# Patient Record
Sex: Male | Born: 1995 | Hispanic: Yes | Marital: Single | Smoking: Never smoker
Health system: Southern US, Community
[De-identification: ages and names within clinical notes are randomized; demographics above are authoritative.]

## PROBLEM LIST (undated history)

## (undated) HISTORY — PX: NO PAST SURGERIES: SHX2092

## (undated) HISTORY — PX: CLAVICLE SURGERY: SHX598

---
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
## 2015-04-18 ENCOUNTER — Encounter (HOSPITAL_COMMUNITY): Payer: Self-pay | Admitting: Emergency Medicine

## 2015-04-18 ENCOUNTER — Emergency Department (HOSPITAL_COMMUNITY): Payer: Medicaid Other

## 2015-04-18 ENCOUNTER — Emergency Department (HOSPITAL_COMMUNITY)
Admission: EM | Admit: 2015-04-18 | Discharge: 2015-04-18 | Disposition: A | Discharge status: Home / Self Care | Payer: Medicaid Other | Attending: Emergency Medicine | Admitting: Emergency Medicine

## 2015-04-18 DIAGNOSIS — R51 Headache: Secondary | ICD-10-CM | POA: Diagnosis present

## 2015-04-18 DIAGNOSIS — Z7982 Long term (current) use of aspirin: Secondary | ICD-10-CM | POA: Diagnosis not present

## 2015-04-18 DIAGNOSIS — H539 Unspecified visual disturbance: Secondary | ICD-10-CM | POA: Diagnosis not present

## 2015-04-18 DIAGNOSIS — R519 Headache, unspecified: Secondary | ICD-10-CM

## 2015-04-18 DIAGNOSIS — R42 Dizziness and giddiness: Secondary | ICD-10-CM | POA: Diagnosis not present

## 2015-04-18 LAB — CBC WITH DIFFERENTIAL/PLATELET
Basophils Absolute: 0 10*3/uL (ref 0.0–0.1)
Basophils Relative: 0 % (ref 0–1)
EOS PCT: 2 % (ref 0–5)
Eosinophils Absolute: 0.2 10*3/uL (ref 0.0–0.7)
HCT: 45.1 % (ref 39.0–52.0)
Hemoglobin: 15.2 g/dL (ref 13.0–17.0)
LYMPHS PCT: 33 % (ref 12–46)
Lymphs Abs: 2.6 10*3/uL (ref 0.7–4.0)
MCH: 29.9 pg (ref 26.0–34.0)
MCHC: 33.7 g/dL (ref 30.0–36.0)
MCV: 88.6 fL (ref 78.0–100.0)
MONO ABS: 0.5 10*3/uL (ref 0.1–1.0)
MONOS PCT: 6 % (ref 3–12)
Neutro Abs: 4.6 10*3/uL (ref 1.7–7.7)
Neutrophils Relative %: 59 % (ref 43–77)
Platelets: 262 10*3/uL (ref 150–400)
RBC: 5.09 MIL/uL (ref 4.22–5.81)
RDW: 12.6 % (ref 11.5–15.5)
WBC: 8 10*3/uL (ref 4.0–10.5)

## 2015-04-18 LAB — COMPREHENSIVE METABOLIC PANEL
ALBUMIN: 3.9 g/dL (ref 3.5–5.0)
ALT: 21 U/L (ref 17–63)
AST: 19 U/L (ref 15–41)
Alkaline Phosphatase: 56 U/L (ref 38–126)
Anion gap: 8 (ref 5–15)
BILIRUBIN TOTAL: 0.8 mg/dL (ref 0.3–1.2)
BUN: 16 mg/dL (ref 6–20)
CO2: 23 mmol/L (ref 22–32)
CREATININE: 0.65 mg/dL (ref 0.61–1.24)
Calcium: 9.1 mg/dL (ref 8.9–10.3)
Chloride: 106 mmol/L (ref 101–111)
GFR calc Af Amer: >60 mL/min (ref >60)
GFR calc non Af Amer: >60 mL/min (ref >60)
Glucose, Bld: 93 mg/dL (ref 70–99)
POTASSIUM: 3.9 mmol/L (ref 3.5–5.1)
Sodium: 137 mmol/L (ref 135–145)
Total Protein: 6.9 g/dL (ref 6.5–8.1)

## 2015-04-18 MED ORDER — KETOROLAC TROMETHAMINE 30 MG/ML IJ SOLN
30.0000 mg | Freq: Once | INTRAMUSCULAR | Status: AC
  Administered 2015-04-18: 30 mg via INTRAVENOUS
  Filled 2015-04-18: qty 1

## 2015-04-18 MED ORDER — SODIUM CHLORIDE 0.9 % IV BOLUS (SEPSIS)
1000.0000 mL | Freq: Once | INTRAVENOUS | Status: AC
  Administered 2015-04-18: 1000 mL via INTRAVENOUS

## 2015-04-18 MED ORDER — NAPROXEN 500 MG PO TABS: 500.0000 mg | ORAL_TABLET | Freq: Two times a day (BID) | ORAL | Status: DC

## 2015-04-18 MED ORDER — PROCHLORPERAZINE EDISYLATE 5 MG/ML IJ SOLN
10.0000 mg | Freq: Once | INTRAMUSCULAR | Status: AC
  Administered 2015-04-18: 10 mg via INTRAVENOUS
  Filled 2015-04-18: qty 2

## 2015-04-18 MED ORDER — DIPHENHYDRAMINE HCL 50 MG/ML IJ SOLN
25.0000 mg | Freq: Once | INTRAMUSCULAR | Status: AC
  Administered 2015-04-18: 25 mg via INTRAVENOUS
  Filled 2015-04-18: qty 1

## 2015-04-18 NOTE — Discharge Instructions (Signed)
You are having a headache. No specific cause was found today for your headache. It may have been a migraine or other cause of headache. Stress, anxiety, fatigue, and depression are common triggers for headaches. Your headache today does not appear to be life-threatening or require hospitalization, but often the exact cause of headaches is not determined in the emergency department. Therefore, follow-up with your doctor is very important to find out what may have caused your headache, and whether or not you need any further diagnostic testing or treatment. Sometimes headaches can appear benign (not harmful), but then more serious symptoms can develop which should prompt an immediate re-evaluation by your doctor or the emergency department. SEEK MEDICAL ATTENTION IF: You develop possible problems with medications prescribed.  The medications don't resolve your headache, if it recurs , or if you have multiple episodes of vomiting or can't take fluids. You have a change from the usual headache. RETURN IMMEDIATELY IF you develop a sudden, severe headache or confusion, become poorly responsive or faint, develop a fever above 100.69F or problem breathing, have a change in speech, vision, swallowing, or understanding, or develop new weakness, numbness, tingling, incoordination, or have a seizure.   General Headache Without Cause A headache is pain or discomfort felt around the head or neck area. The specific cause of a headache may not be found. There are many causes and types of headaches. A few common ones are:  Tension headaches.  Migraine headaches.  Cluster headaches.  Chronic daily headaches. HOME CARE INSTRUCTIONS   Keep all follow-up appointments with your caregiver or any specialist referral.  Only take over-the-counter or prescription medicines for pain or discomfort as directed by your caregiver.  Lie down in a dark, quiet room when you have a headache.  Keep a headache journal to find  out what may trigger your migraine headaches. For example, write down:  What you eat and drink.  How much sleep you get.  Any change to your diet or medicines.  Try massage or other relaxation techniques.  Put ice packs or heat on the head and neck. Use these 3 to 4 times per day for 15 to 20 minutes each time, or as needed.  Limit stress.  Sit up straight, and do not tense your muscles.  Quit smoking if you smoke.  Limit alcohol use.  Decrease the amount of caffeine you drink, or stop drinking caffeine.  Eat and sleep on a regular schedule.  Get 7 to 9 hours of sleep, or as recommended by your caregiver.  Keep lights dim if bright lights bother you and make your headaches worse. SEEK MEDICAL CARE IF:   You have problems with the medicines you were prescribed.  Your medicines are not working.  You have a change from the usual headache.  You have nausea or vomiting. SEEK IMMEDIATE MEDICAL CARE IF:   Your headache becomes severe.  You have a fever.  You have a stiff neck.  You have loss of vision.  You have muscular weakness or loss of muscle control.  You start losing your balance or have trouble walking.  You feel faint or pass out.  You have severe symptoms that are different from your first symptoms. MAKE SURE YOU:   Understand these instructions.  Will watch your condition.  Will get help right away if you are not doing well or get worse. Document Released: 11/24/2005 Document Revised: 02/16/2012 Document Reviewed: 12/10/2011 [facility] Patient Information 2015 [place], [place]. This information is not intended  to replace advice given to you by your health care provider. Make sure you discuss any questions you have with your health care provider.  Migraine Headache A migraine headache is an intense, throbbing pain on one or both sides of your head. A migraine can last for 30 minutes to several hours. CAUSES  The exact cause of a migraine headache is  not always known. However, a migraine may be caused when nerves in the brain become irritated and release chemicals that cause inflammation. This causes pain. Certain things may also trigger migraines, such as:  Alcohol.  Smoking.  Stress.  Menstruation.  Aged cheeses.  Foods or drinks that contain nitrates, glutamate, aspartame, or tyramine.  Lack of sleep.  Chocolate.  Caffeine.  Hunger.  Physical exertion.  Fatigue.  Medicines used to treat chest pain (nitroglycerine), birth control pills, estrogen, and some blood pressure medicines. SIGNS AND SYMPTOMS  Pain on one or both sides of your head.  Pulsating or throbbing pain.  Severe pain that prevents daily activities.  Pain that is aggravated by any physical activity.  Nausea, vomiting, or both.  Dizziness.  Pain with exposure to bright lights, loud noises, or activity.  General sensitivity to bright lights, loud noises, or smells. Before you get a migraine, you may get warning signs that a migraine is coming (aura). An aura may include:  Seeing flashing lights.  Seeing bright spots, halos, or zigzag lines.  Having tunnel vision or blurred vision.  Having feelings of numbness or tingling.  Having trouble talking.  Having muscle weakness. DIAGNOSIS  A migraine headache is often diagnosed based on:  Symptoms.  Physical exam.  A CT scan or MRI of your head. These imaging tests cannot diagnose migraines, but they can help rule out other causes of headaches. TREATMENT Medicines may be given for pain and nausea. Medicines can also be given to help prevent recurrent migraines.  HOME CARE INSTRUCTIONS  Only take over-the-counter or prescription medicines for pain or discomfort as directed by your health care provider. The use of long-term narcotics is not recommended.  Lie down in a dark, quiet room when you have a migraine.  Keep a journal to find out what may trigger your migraine headaches. For  example, write down:  What you eat and drink.  How much sleep you get.  Any change to your diet or medicines.  Limit alcohol consumption.  Quit smoking if you smoke.  Get 7-9 hours of sleep, or as recommended by your health care provider.  Limit stress.  Keep lights dim if bright lights bother you and make your migraines worse. SEEK IMMEDIATE MEDICAL CARE IF:   Your migraine becomes severe.  You have a fever.  You have a stiff neck.  You have vision loss.  You have muscular weakness or loss of muscle control.  You start losing your balance or have trouble walking.  You feel faint or pass out.  You have severe symptoms that are different from your first symptoms. MAKE SURE YOU:   Understand these instructions.  Will watch your condition.  Will get help right away if you are not doing well or get worse. Document Released: 11/24/2005 Document Revised: 04/10/2014 Document Reviewed: 08/01/2013 [facility] Patient Information 2015 [place], [place]. This information is not intended to replace advice given to you by your health care provider. Make sure you discuss any questions you have with your health care provider.

## 2015-04-18 NOTE — ED Notes (Signed)
PA at bedside.

## 2015-04-18 NOTE — ED Notes (Signed)
Pt reports headaches intermittent and dizziness for 2 weeks. Reports blurry vision with a headache on Saturday and Monday. Denies weakness, slurred speech, or other neuro deficits at this time. Last episode of dizziness was Monday.

## 2015-04-18 NOTE — ED Provider Notes (Signed)
CSN: [id number]     Arrival date & time 04/18/15  1049 History  This chart was scribed for a non-physician practitioner, Arthor CaptainAbigail Braydon Kullman, PA-C working with Jerelyn ScottMartha Linker, MD by [place] Peace, ED Scribe. The patient was seen in TR01C/TR01C. [name] care was started at 4:12 PM.    Chief Complaint  Patient presents with  . Headache      Patient is a 19 y.o. male presenting with headaches. The history is provided by the patient. No language interpreter was used.  Headache Associated symptoms: dizziness   Associated symptoms: no fever, no nausea, no neck pain, no vomiting and no weakness     HPI Comments: Marcus Jackson is a 19 y.o. male who presents to the Emergency Department complaining of intermittent episodes of headache over the past 2 weeks with dizziness. Pt explains each episodes last about 10 mins and move to different areas of his head with varying levels of pain. Rates worst pain as 7/10. He reports episode a few days ago where he started to experience blurred vision that lasted about 30 seconds. Pt reports he is not currently experiencing a headache. No complaints of weakness or slurred speech. He denies any neck pain, nausea, vomiting, or fever. Pt has not taken any medication to address symptoms. No family history of strokes.    History reviewed. No pertinent past medical history. Past Surgical History  Procedure Laterality Date  . Clavicle surgery    . Anterior cervical decomp/discectomy fusion N/A 10/14/2013    Procedure: Anterior cervical decompression fusion Cervical five-six;  Surgeon: Carmela HurtKyle L Cabbell, MD;  Location: [facility];  Service: Neurosurgery;  Laterality: N/A;  ACDF Cervical five-six   History reviewed. No pertinent family history. History  Substance Use Topics  . Smoking status: Never Smoker   . Smokeless tobacco: Not on file  . Alcohol Use: No    Review of Systems  Constitutional: Negative for fever.  Eyes: Positive for visual disturbance.   Gastrointestinal: Negative for nausea and vomiting.  Musculoskeletal: Negative for neck pain.  Neurological: Positive for dizziness and headaches. Negative for speech difficulty and weakness.  All other systems reviewed and are negative. Ten systems reviewed and are negative for acute change, except as noted in the HPI.      Allergies  Review of patient's allergies indicates no known allergies.  Home Medications   Prior to Admission medications   Medication Sig Start Date End Date Taking? Authorizing Provider  aspirin EC 325 MG tablet Take 325 mg by mouth daily.    Historical Provider, MD  cyclobenzaprine (FLEXERIL) 10 MG tablet Take 1 tablet (10 mg total) by mouth 3 (three) times daily as needed for muscle spasms. 10/14/13   Coletta MemosKyle Cabbell, MD  HYDROcodone-acetaminophen (NORCO/VICODIN) 5-325 MG per tablet Take 1 tablet by mouth every 6 (six) hours as needed for moderate pain. 10/14/13   Coletta MemosKyle Cabbell, MD   BP 133/64 mmHg  Pulse 63  Temp(Src) 98.1 F (36.7 C) (Oral)  Resp 18  SpO2 97% Physical Exam  Constitutional: He is oriented to person, place, and time. He appears well-developed and well-nourished. No distress.  HENT:  Head: Normocephalic and atraumatic.  Mouth/Throat: Oropharynx is clear and moist.  Eyes: Conjunctivae and EOM are normal. Pupils are equal, round, and reactive to light. No scleral icterus.  No horizontal, vertical or rotational nystagmus  Neck: Normal range of motion. Neck supple. No tracheal deviation present.  Full active and passive ROM without pain No midline or paraspinal tenderness No  nuchal rigidity or meningeal signs  Cardiovascular: Normal rate, regular rhythm and intact distal pulses.   Pulmonary/Chest: Effort normal and breath sounds normal. No respiratory distress. He has no wheezes. He has no rales.  Abdominal: Soft. Bowel sounds are normal. There is no tenderness. There is no rebound and no guarding.  Musculoskeletal: Normal range of motion.   Lymphadenopathy:    He has no cervical adenopathy.  Neurological: He is alert and oriented to person, place, and time. He has normal reflexes. No cranial nerve deficit. He exhibits normal muscle tone. Coordination normal.  Mental Status:  Alert, oriented, thought content appropriate. Speech fluent without evidence of aphasia. Able to follow 2 step commands without difficulty.  Cranial Nerves:  II:  Peripheral visual fields grossly normal, pupils equal, round, reactive to light III,IV, VI: ptosis not present, extra-ocular motions intact bilaterally  V,VII: smile symmetric, facial light touch sensation equal VIII: hearing grossly normal bilaterally  IX,X: gag reflex present  XI: bilateral shoulder shrug equal and strong XII: midline tongue extension  Motor:  5/5 in upper and lower extremities bilaterally including strong and equal grip strength and dorsiflexion/plantar flexion Sensory: Pinprick and light touch normal in all extremities.  Deep Tendon Reflexes: 2+ and symmetric  Cerebellar: normal finger-to-nose with bilateral upper extremities Gait: normal gait and balance CV: distal pulses palpable throughout   Skin: Skin is warm and dry. No rash noted. He is not diaphoretic.  Psychiatric: He has a normal mood and affect. His behavior is normal. Judgment and thought content normal.  Nursing note and vitals reviewed.   ED Course  Procedures (including critical care time) Labs Review Labs Reviewed  COMPREHENSIVE METABOLIC PANEL  CBC WITH DIFFERENTIAL/PLATELET    Imaging Review No results found.   EKG Interpretation None     Medications  sodium chloride 0.9 % bolus 1,000 mL (1,000 mLs Intravenous New Bag/Given 04/18/15 1546)  ketorolac (TORADOL) 30 MG/ML injection 30 mg (30 mg Intravenous Given 04/18/15 1547)  prochlorperazine (COMPAZINE) injection 10 mg (10 mg Intravenous Given 04/18/15 1549)  diphenhydrAMINE (BENADRYL) injection 25 mg (25 mg Intravenous Given 04/18/15 1551)    4:18 PM- Treatment plan was discussed with patient who verbalizes understanding and agrees.   MDM   Final diagnoses:  Headache    Negative head CT Pt HA treated and improved while in ED.  Presentation is like pts typical HA and non concerning for [facility], ICH, Meningitis, or temporal arteritis. Pt is afebrile with no focal neuro deficits, nuchal rigidity, or change in vision. Pt is to follow up with PCP to discuss prophylactic medication. Pt verbalizes understanding and is agreeable with plan to dc.    I personally performed the services described in this documentation, which was scribed in my presence. The recorded information has been reviewed and is accurate.      Arthor Captainbigail Makenah Karas, PA-C 04/20/15 1621  Raeford RazorStephen Kohut, MD 04/20/15 [phone]
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
## 2015-12-10 ENCOUNTER — Encounter (HOSPITAL_COMMUNITY): Payer: Self-pay | Admitting: Emergency Medicine

## 2015-12-10 ENCOUNTER — Emergency Department (INDEPENDENT_AMBULATORY_CARE_PROVIDER_SITE_OTHER)
Admission: EM | Admit: 2015-12-10 | Discharge: 2015-12-10 | Disposition: A | Discharge status: Home / Self Care | Payer: Medicaid Other | Source: Home / Self Care | Attending: Internal Medicine | Admitting: Internal Medicine

## 2015-12-10 DIAGNOSIS — S61412A Laceration without foreign body of left hand, initial encounter: Secondary | ICD-10-CM

## 2015-12-10 MED ORDER — LIDOCAINE-EPINEPHRINE-TETRACAINE (LET) SOLUTION
3.0000 mL | Freq: Once | NASAL | Status: AC
  Administered 2015-12-10: 3 mL via TOPICAL

## 2015-12-10 MED ORDER — CEPHALEXIN 500 MG PO CAPS: 500.0000 mg | ORAL_CAPSULE | Freq: Two times a day (BID) | ORAL | Status: DC

## 2015-12-10 MED ORDER — LIDOCAINE HCL (PF) 1 % IJ SOLN
INTRAMUSCULAR | Status: AC
  Filled 2015-12-10: qty 30

## 2015-12-10 MED ORDER — LIDOCAINE-EPINEPHRINE-TETRACAINE (LET) SOLUTION
NASAL | Status: AC
  Filled 2015-12-10: qty 3

## 2015-12-10 NOTE — Discharge Instructions (Signed)
4 outside and 2 inside stitches were placed.   Please followup with Dr Mina MarbleWeingold in 2-3 days to check wound,  and the strength/motion of the left 2nd finger. Leave bandage on until you see Dr Mina MarbleWeingold. Prescriptions for keflex (antibiotic) and naprox[facility] (for pain)  [facility] [address]   Laceration Care, Adult A laceration is a cut that goes through all layers of the skin. The cut also goes into the tissue that is right under the skin. Some cuts heal on their own. Others need to be closed with stitches (sutures), staples, skin adhesive strips, or wound glue. Taking care of your cut lowers your risk of infection and helps your cut to heal better. HOW TO TAKE CARE OF YOUR CUT For stitches or staples:  Keep the wound clean and dry.  If you were given a bandage (dressing), you should change it at least one time per day or as told by your doctor. You should also change it if it gets wet or dirty.  Keep the wound completely dry for the first 24 hours or as told by your doctor. After that time, you may take a shower or a bath. However, make sure that the wound is not soaked in water until after the stitches or staples have been removed.  Clean the wound one time each day or as told by your doctor:  Wash the wound with soap and water.  Rinse the wound with water until all of the soap comes off.  Pat the wound dry with a clean towel. Do not rub the wound.  After you clean the wound, put a thin layer of antibiotic ointment on it as told by your doctor. This ointment:  Helps to prevent infection.  Keeps the bandage from sticking to the wound.  Have your stitches or staples removed as told by your doctor. If your doctor used skin adhesive strips:   Keep the wound clean and dry.  If you were given a bandage, you should change it at least one time per day or as told by your doctor. You should also change it if it gets dirty or wet.  Do not get the skin adhesive strips wet. You  can take a shower or a bath, but be careful to keep the wound dry.  If the wound gets wet, pat it dry with a clean towel. Do not rub the wound.  Skin adhesive strips fall off on their own. You can trim the strips as the wound heals. Do not remove any strips that are still stuck to the wound. They will fall off after a while. If your doctor used wound glue:  Try to keep your wound dry, but you may briefly wet it in the shower or bath. Do not soak the wound in water, such as by swimming.  After you take a shower or a bath, gently pat the wound dry with a clean towel. Do not rub the wound.  Do not do any activities that will make you really sweaty until the skin glue has fallen off on its own.  Do not apply liquid, cream, or ointment medicine to your wound while the skin glue is still on.  If you were given a bandage, you should change it at least one time per day or as told by your doctor. You should also change it if it gets dirty or wet.  If a bandage is placed over the wound, do not let the tape for the  bandage touch the skin glue.  Do not pick at the glue. The skin glue usually stays on for 5-10 days. Then, it falls off of the skin. General Instructions  To help prevent scarring, make sure to cover your wound with sunscreen whenever you are outside after stitches are removed, after adhesive strips are removed, or when wound glue stays in place and the wound is healed. Make sure to wear a sunscreen of at least 30 SPF.  Take over-the-counter and prescription medicines only as told by your doctor.  If you were given antibiotic medicine or ointment, take or apply it as told by your doctor. Do not stop using the antibiotic even if your wound is getting better.  Do not scratch or pick at the wound.  Keep all follow-up visits as told by your doctor. This is important.  Check your wound every day for signs of infection. Watch for:  Redness, swelling, or pain.  Fluid, blood, or  pus.  Raise (elevate) the injured area above the level of your heart while you are sitting or lying down, if possible. GET HELP IF:  You got a tetanus shot and you have any of these problems at the injection site:  Swelling.  Very bad pain.  Redness.  Bleeding.  You have a fever.  A wound that was closed breaks open.  You notice a bad smell coming from your wound or your bandage.  You notice something coming out of the wound, such as wood or glass.  Medicine does not help your pain.  You have more redness, swelling, or pain at the site of your wound.  You have fluid, blood, or pus coming from your wound.  You notice a change in the color of your skin near your wound.  You need to change the bandage often because fluid, blood, or pus is coming from the wound.  You start to have a new rash.  You start to have numbness around the wound. GET HELP RIGHT AWAY IF:  You have very bad swelling around the wound.  Your pain suddenly gets worse and is very bad.  You notice painful lumps near the wound or on skin that is anywhere on your body.  You have a red streak going away from your wound.  The wound is on your hand or foot and you cannot move a finger or toe like you usually can.  The wound is on your hand or foot and you notice that your fingers or toes look pale or bluish.   This information is not intended to replace advice given to you by your health care provider. Make sure you discuss any questions you have with your health care provider.   Document Released: 05/12/2008 Document Revised: 04/10/2015 Document Reviewed: 11/20/2014 Elsevier Interactive Patient Education [employer]

## 2015-12-10 NOTE — ED Notes (Addendum)
Patient reports he was using a power saw and cut his left index finger and thumb about one hour ago. Laceration is about 1 inch long on index finger and 0.5 inch on thumb.  Patient denies numbness. Last tetanus in 2014 per patient.

## 2015-12-11 NOTE — ED Provider Notes (Signed)
CSN: [id number]     Arrival date & time 12/10/15  1536 History   First MD Initiated Contact with Patient 12/10/15 1645     Chief Complaint  Patient presents with  . Hand Injury   HPI  Using a rotary tool with a toothed blade to carve a design in wood today, tool slipped and presents now with abrasion to ulnar aspect L thumb, and deep irregular lac 2" over left 2nd MCP. Moving L hand well. No other injury reported.  Had tetanus shot in 2014.  History reviewed. No pertinent past medical history. Past Surgical History  Procedure Laterality Date       . Orif clavicular fracture Left 09/20/2013    Procedure: OPEN REDUCTION INTERNAL FIXATION (ORIF) CLAVICULAR FRACTURE, Dressing application to left hand;  Surgeon: Sheral Apley, MD;  Location: [facility] OR;  Service: Orthopedics;  Laterality: Left;   History reviewed. No pertinent family history. Social History  Substance Use Topics  . Smoking status: Never Smoker   . Smokeless tobacco: None  . Alcohol Use: No    Review of Systems  All other systems reviewed and are negative.   Allergies  Review of patient's allergies indicates no known allergies.  Home Medications   Prior to Admission medications   Medication Sig Start Date End Date Taking? Authorizing Provider  aspirin EC 325 MG tablet Take 1 tablet (325 mg total) by mouth daily. 09/20/13   Sheral Apley, MD  cephALEXin (KEFLEX) 500 MG capsule Take 1 capsule (500 mg total) by mouth 2 (two) times daily. 12/10/15   Eustace Moore, MD  methocarbamol (ROBAXIN) 500 MG tablet Take 1 tablet (500 mg total) by mouth 2 (two) times daily. 09/18/13   Susy Frizzle, MD  oxyCODONE-acetaminophen (PERCOCET/ROXICET) 5-325 MG per tablet Take 2 tablets by mouth every 4 (four) hours as needed for pain. 09/18/13   Susy Frizzle, MD  oxyCODONE-acetaminophen (ROXICET) 5-325 MG per tablet Take 2 tablets by mouth every 4 (four) hours as needed for pain. 09/20/13   Sheral Apley, MD   Meds Ordered and  Administered this Visit   Medications  lidocaine-EPINEPHrine-tetracaine (LET) solution (3 mLs Topical Given 12/10/15 1638)  1% lidocaine without epi, 4 cc, for wound infiltration by provider  BP 134/77 mmHg  Pulse 74  Temp(Src) 99 F (37.2 C) (Oral)  Resp 14  SpO2 100%   Physical Exam  Constitutional: He is oriented to person, place, and time. No distress.  Alert, nicely groomed  HENT:  Head: Atraumatic.  Eyes:  Conjugate gaze, no eye redness/drainage  Neck: Neck supple.  Cardiovascular: Normal rate.   Pulmonary/Chest: No respiratory distress.  Abdominal: He exhibits no distension.  Musculoskeletal:  ROM full in left 2nd finger, left thumb. Resisted flexion/extension at L 2nd MCP, PIP, strong. Deep 2" laceration, stellate, dorsal L 2nd MCP. 1.25" full thickness abrasion ulnar aspect of L thumb at IP.  Neurological: He is alert and oriented to person, place, and time.  Skin: Skin is warm and dry.  No cyanosis  Nursing note and vitals reviewed.   ED Course  Procedures (including critical care time)  LET applied to deep laceration over left, dorsal 2nd MCP. Wound infiltrated with 1% lidocaine without epi.   Wound washed with hibiclens/saline and irrigated copiously with saline. Resisted flexion/extension at MCP and PIP strong. Placed #2 4-0 vicryl sutures to loosely approximate gaping deep space and #4 4-0 ethilon sutures to loosely approximate superficial tissues. Dry dressing/splint applied by nurse. Pt to leave  in place until followup with Dr Dwana CurdWeingold/hand in 2days for wound check, check strength/motion in L 2nd finger.   MDM   1. Laceration of left hand, initial encounter    Discharge Medication List as of 12/10/2015  5:57 PM    START taking these medications   Details  cephALEXin (KEFLEX) 500 MG capsule Take 1 capsule (500 mg total) by mouth 2 (two) times daily., Starting 12/10/2015, Until Discontinued, Normal           Eustace MooreLaura W Kynadee Dam, MD 12/12/15 1057

## 2016-07-31 ENCOUNTER — Encounter (HOSPITAL_COMMUNITY): Payer: Self-pay | Admitting: Emergency Medicine

## 2017-09-08 ENCOUNTER — Encounter (HOSPITAL_COMMUNITY): Payer: Self-pay

## 2017-09-08 ENCOUNTER — Emergency Department (HOSPITAL_COMMUNITY): Payer: Worker's Compensation

## 2017-09-08 ENCOUNTER — Emergency Department (HOSPITAL_COMMUNITY)
Admission: EM | Admit: 2017-09-08 | Discharge: 2017-09-08 | Disposition: A | Discharge status: Home / Self Care | Payer: Worker's Compensation | Attending: Emergency Medicine | Admitting: Emergency Medicine

## 2017-09-08 DIAGNOSIS — Y92410 Unspecified street and highway as the place of occurrence of the external cause: Secondary | ICD-10-CM | POA: Diagnosis not present

## 2017-09-08 DIAGNOSIS — S93402A Sprain of unspecified ligament of left ankle, initial encounter: Secondary | ICD-10-CM | POA: Diagnosis not present

## 2017-09-08 DIAGNOSIS — R0789 Other chest pain: Secondary | ICD-10-CM

## 2017-09-08 DIAGNOSIS — Y9389 Activity, other specified: Secondary | ICD-10-CM | POA: Diagnosis not present

## 2017-09-08 DIAGNOSIS — R109 Unspecified abdominal pain: Secondary | ICD-10-CM | POA: Insufficient documentation

## 2017-09-08 DIAGNOSIS — Y999 Unspecified external cause status: Secondary | ICD-10-CM | POA: Insufficient documentation

## 2017-09-08 DIAGNOSIS — S8001XA Contusion of right knee, initial encounter: Secondary | ICD-10-CM | POA: Insufficient documentation

## 2017-09-08 DIAGNOSIS — S8991XA Unspecified injury of right lower leg, initial encounter: Secondary | ICD-10-CM | POA: Diagnosis present

## 2017-09-08 LAB — CBC WITH DIFFERENTIAL/PLATELET
Basophils Absolute: 0 10*3/uL (ref 0.0–0.1)
Basophils Relative: 0 %
Eosinophils Absolute: 0.2 10*3/uL (ref 0.0–0.7)
Eosinophils Relative: 1 %
HCT: 44.5 % (ref 39.0–52.0)
Hemoglobin: 15.2 g/dL (ref 13.0–17.0)
Lymphocytes Relative: 23 %
Lymphs Abs: 3.4 10*3/uL (ref 0.7–4.0)
MCH: 31 pg (ref 26.0–34.0)
MCHC: 34.2 g/dL (ref 30.0–36.0)
MCV: 90.6 fL (ref 78.0–100.0)
Monocytes Absolute: 0.9 10*3/uL (ref 0.1–1.0)
Monocytes Relative: 6 %
Neutro Abs: 10.3 10*3/uL — ABNORMAL HIGH (ref 1.7–7.7)
Neutrophils Relative %: 70 %
Platelets: 264 10*3/uL (ref 150–400)
RBC: 4.91 MIL/uL (ref 4.22–5.81)
RDW: 12.9 % (ref 11.5–15.5)
WBC: 14.7 10*3/uL — ABNORMAL HIGH (ref 4.0–10.5)

## 2017-09-08 LAB — COMPREHENSIVE METABOLIC PANEL
ALT: 29 U/L (ref 17–63)
AST: 26 U/L (ref 15–41)
Albumin: 4 g/dL (ref 3.5–5.0)
Alkaline Phosphatase: 57 U/L (ref 38–126)
Anion gap: 9 (ref 5–15)
BUN: 16 mg/dL (ref 6–20)
CO2: 24 mmol/L (ref 22–32)
Calcium: 9 mg/dL (ref 8.9–10.3)
Chloride: 103 mmol/L (ref 101–111)
Creatinine, Ser: 0.7 mg/dL (ref 0.61–1.24)
GFR calc Af Amer: >60 mL/min (ref >60)
GFR calc non Af Amer: >60 mL/min (ref >60)
Glucose, Bld: 94 mg/dL (ref 65–99)
Potassium: 4.1 mmol/L (ref 3.5–5.1)
Sodium: 136 mmol/L (ref 135–145)
Total Bilirubin: 0.9 mg/dL (ref 0.3–1.2)
Total Protein: 6.7 g/dL (ref 6.5–8.1)

## 2017-09-08 LAB — LIPASE, BLOOD: Lipase: 24 U/L (ref 11–51)

## 2017-09-08 MED ORDER — MORPHINE SULFATE (PF) 4 MG/ML IV SOLN
4.0000 mg | Freq: Once | INTRAVENOUS | Status: AC
  Administered 2017-09-08: 4 mg via INTRAVENOUS
  Filled 2017-09-08: qty 1

## 2017-09-08 MED ORDER — IOPAMIDOL (ISOVUE-300) INJECTION 61%
INTRAVENOUS | Status: DC
Start: 2017-09-08 — End: 2017-09-08
  Filled 2017-09-08: qty 100

## 2017-09-08 MED ORDER — IOPAMIDOL (ISOVUE-300) INJECTION 61%
INTRAVENOUS | Status: AC
  Administered 2017-09-08: 100 mL
  Filled 2017-09-08: qty 100

## 2017-09-08 MED ORDER — TRAMADOL HCL 50 MG PO TABS: 50.0000 mg | ORAL_TABLET | Freq: Four times a day (QID) | ORAL | 0 refills | Status: DC | PRN

## 2017-09-08 NOTE — ED Notes (Signed)
Patient transported to CT 

## 2017-09-08 NOTE — Discharge Instructions (Signed)
Please read attached information. If you experience any new or worsening signs or symptoms please return to the emergency room for evaluation. Please follow-up with your primary care provider or specialist as discussed. Please use medication prescribed only as directed and discontinue taking if you have any concerning signs or symptoms.   °

## 2017-09-08 NOTE — Progress Notes (Signed)
Orthopedic Tech Progress Note Patient Details:  Marcus Jackson October 31, 1996 [id number]  Ortho Devices Type of Ortho Device: ASO Ortho Device/Splint Location: LLE Ortho Device/Splint Interventions: Ordered, Application   Jennye Moccasin 09/08/2017, 6:11 PM
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
## 2017-09-15 ENCOUNTER — Emergency Department (HOSPITAL_COMMUNITY)
Admission: EM | Admit: 2017-09-15 | Discharge: 2017-09-15 | Disposition: A | Discharge status: Home / Self Care | Payer: Worker's Compensation | Attending: Emergency Medicine | Admitting: Emergency Medicine

## 2017-09-15 ENCOUNTER — Emergency Department (HOSPITAL_COMMUNITY): Payer: Worker's Compensation

## 2017-09-15 ENCOUNTER — Encounter (HOSPITAL_COMMUNITY): Payer: Self-pay

## 2017-09-15 DIAGNOSIS — R0789 Other chest pain: Secondary | ICD-10-CM | POA: Diagnosis not present

## 2017-09-15 DIAGNOSIS — R079 Chest pain, unspecified: Secondary | ICD-10-CM | POA: Diagnosis present

## 2017-09-15 LAB — I-STAT TROPONIN, ED: TROPONIN I, POC: 0 ng/mL (ref 0.00–0.08)

## 2017-09-15 MED ORDER — NAPROXEN 375 MG PO TABS: 375.0000 mg | ORAL_TABLET | Freq: Two times a day (BID) | ORAL | 0 refills | Status: DC

## 2017-09-15 MED ORDER — CYCLOBENZAPRINE HCL 10 MG PO TABS: 10.0000 mg | ORAL_TABLET | Freq: Three times a day (TID) | ORAL | 0 refills | Status: DC | PRN
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
## 2020-01-09 ENCOUNTER — Other Ambulatory Visit: Payer: Self-pay | Admitting: Cardiology

## 2020-01-09 DIAGNOSIS — Z20822 Contact with and (suspected) exposure to covid-19: Secondary | ICD-10-CM

## 2020-01-10 LAB — NOVEL CORONAVIRUS, NAA: SARS-CoV-2, NAA: NOT DETECTED

## 2020-07-27 ENCOUNTER — Ambulatory Visit (HOSPITAL_COMMUNITY)
Admission: EM | Admit: 2020-07-27 | Discharge: 2020-07-27 | Disposition: A | Discharge status: Home / Self Care | Payer: HRSA Program | Attending: Family Medicine | Admitting: Family Medicine

## 2020-07-27 ENCOUNTER — Other Ambulatory Visit: Payer: Self-pay

## 2020-07-27 ENCOUNTER — Encounter (HOSPITAL_COMMUNITY): Payer: Self-pay | Admitting: Emergency Medicine

## 2020-07-27 DIAGNOSIS — Z20822 Contact with and (suspected) exposure to covid-19: Secondary | ICD-10-CM | POA: Diagnosis present

## 2020-07-27 DIAGNOSIS — J069 Acute upper respiratory infection, unspecified: Secondary | ICD-10-CM | POA: Insufficient documentation

## 2020-07-27 LAB — SARS CORONAVIRUS 2 (TAT 6-24 HRS): SARS Coronavirus 2: POSITIVE — AB

## 2020-07-27 MED ORDER — IBUPROFEN 800 MG PO TABS: 800.0000 mg | ORAL_TABLET | Freq: Three times a day (TID) | ORAL | 0 refills | Status: AC

## 2020-07-27 MED ORDER — ACETAMINOPHEN 325 MG PO TABS
ORAL_TABLET | ORAL | Status: AC
  Filled 2020-07-27: qty 2

## 2020-07-27 MED ORDER — ACETAMINOPHEN 325 MG PO TABS
650.0000 mg | ORAL_TABLET | Freq: Once | ORAL | Status: AC
  Administered 2020-07-27: 650 mg via ORAL

## 2020-07-27 MED ORDER — BENZONATATE 200 MG PO CAPS: 200.0000 mg | ORAL_CAPSULE | Freq: Three times a day (TID) | ORAL | 0 refills | Status: AC | PRN

## 2020-07-27 NOTE — ED Triage Notes (Signed)
Pt c/o covid exposure last week Wednesday his inlaws tested positive. He states he started having cough, fever, and headache on Saturday. Pt states he has been taking tylenol and thera flu with relief of symptoms.

## 2020-07-27 NOTE — Discharge Instructions (Signed)
Covid test pending, monitor my chart for results Ibuprofen and Tylenol around-the-clock to help with fevers, headaches, body aches and chills Rest and drink plenty of fluids Benzonatate/Tessalon every 8 hours as needed for cough Please follow-up if any symptoms not improving or worsening, developing any difficulty breathing, shortness of breath

## 2020-07-27 NOTE — ED Provider Notes (Signed)
[facility]    CSN: [id number] Arrival date & time: 07/27/20  0825      History   Chief Complaint Chief Complaint  Patient presents with  . Covid Exposure    HPI Marcus Jackson is a 24 y.o. male no significant past medical history presenting today for URI symptoms and fever.  Patient had Covid exposure last week.  His in-laws have since tested positive.  He developed symptoms on Saturday including cough, congestion, headache chills and fever.  Using Tylenol and TheraFlu.  Denies chest pain or shortness of breath.  Denies sore throat.  Has had some nausea, but denies vomiting.  HPI  History reviewed. No pertinent past medical history.  There are no problems to display for this patient.   Past Surgical History:  Procedure Laterality Date  . ANTERIOR CERVICAL DECOMP/DISCECTOMY FUSION N/A 10/14/2013   Procedure: Anterior cervical decompression fusion Cervical five-six;  Surgeon: Carmela Hurt, MD;  Location: [facility];  Service: Neurosurgery;  Laterality: N/A;  ACDF Cervical five-six  . CLAVICLE SURGERY    . NO PAST SURGERIES    . ORIF CLAVICULAR FRACTURE Left 09/20/2013   Procedure: OPEN REDUCTION INTERNAL FIXATION (ORIF) CLAVICULAR FRACTURE, Dressing application to left hand;  Surgeon: Sheral Apley, MD;  Location: [facility];  Service: Orthopedics;  Laterality: Left;       Home Medications    Prior to Admission medications   Medication Sig Start Date End Date Taking? Authorizing Provider  benzonatate (TESSALON) 200 MG capsule Take 1 capsule (200 mg total) by mouth 3 (three) times daily as needed for up to 7 days for cough. 07/27/20 08/03/20  Reinhold Rickey C, PA-C  ibuprofen (ADVIL) 800 MG tablet Take 1 tablet (800 mg total) by mouth 3 (three) times daily. 07/27/20   Kerissa Coia, Junius Creamer, PA-C    Family History Family History  Problem Relation Age of Onset  . Healthy Mother   . Healthy Father     Social History Social History   Tobacco Use  .  Smoking status: Never Smoker  . Smokeless tobacco: Current User    Types: Chew  Substance Use Topics  . Alcohol use: Yes    Comment: occ  . Drug use: No     Allergies   Patient has no known allergies.   Review of Systems Review of Systems  Constitutional: Positive for chills and fever. Negative for activity change, appetite change and fatigue.  HENT: Positive for congestion. Negative for ear pain, rhinorrhea, sinus pressure, sore throat and trouble swallowing.   Eyes: Negative for discharge and redness.  Respiratory: Positive for cough. Negative for chest tightness and shortness of breath.   Cardiovascular: Negative for chest pain.  Gastrointestinal: Positive for nausea. Negative for abdominal pain, diarrhea and vomiting.  Musculoskeletal: Negative for myalgias.  Skin: Negative for rash.  Neurological: Negative for dizziness, light-headedness and headaches.     Physical Exam Triage Vital Signs ED Triage Vitals  Enc Vitals Group     BP 07/27/20 1044 104/62     Pulse Rate 07/27/20 1044 84     Resp 07/27/20 1044 16     Temp 07/27/20 1044 (!) 102.7 F (39.3 C)     Temp Source 07/27/20 1044 Oral     SpO2 07/27/20 1044 97 %     Weight --      Height --      Head Circumference --      Peak Flow --  Pain Score 07/27/20 1039 7     Pain Loc --      Pain Edu? --      Excl. in GC? --    No data found.  Updated Vital Signs BP 104/62 (BP Location: Right Arm)   Pulse 84   Temp (!) 102.7 F (39.3 C) (Oral)   Resp 16   SpO2 97%   Visual Acuity Right Eye Distance:   Left Eye Distance:   Bilateral Distance:    Right Eye Near:   Left Eye Near:    Bilateral Near:     Physical Exam Vitals and nursing note reviewed.  Constitutional:      Appearance: He is well-developed.     Comments: No acute distress  HENT:     Head: Normocephalic and atraumatic.     Ears:     Comments: Bilateral ears without tenderness to palpation of external auricle, tragus and mastoid,  EAC's without erythema or swelling, TM's with good bony landmarks and cone of light. Non erythematous.     Nose: Nose normal.     Mouth/Throat:     Comments: Oral mucosa pink and moist, no tonsillar enlargement or exudate. Posterior pharynx patent and nonerythematous, no uvula deviation or swelling. Normal phonation. Eyes:     Conjunctiva/sclera: Conjunctivae normal.  Cardiovascular:     Rate and Rhythm: Normal rate.  Pulmonary:     Effort: Pulmonary effort is normal. No respiratory distress.     Comments: Breathing comfortably at rest, CTABL, no wheezing, rales or other adventitious sounds auscultated Abdominal:     General: There is no distension.  Musculoskeletal:        General: Normal range of motion.     Cervical back: Neck supple.  Skin:    General: Skin is warm and dry.  Neurological:     Mental Status: He is alert and oriented to person, place, and time.      UC Treatments / Results  Labs (all labs ordered are listed, but only abnormal results are displayed) Labs Reviewed  SARS CORONAVIRUS 2 (TAT 6-24 HRS)    EKG   Radiology No results found.  Procedures Procedures (including critical care time)  Medications Ordered in UC Medications  acetaminophen (TYLENOL) tablet 650 mg (650 mg Oral Given 07/27/20 1052)    Initial Impression / Assessment and Plan / UC Course  I have reviewed the triage vital signs and the nursing notes.  Pertinent labs & imaging results that were available during my care of the patient were reviewed by me and considered in my medical decision making (see chart for details).     Covid test pending, highly suspicious of Covid given recent exposure as well as fevers.  Lungs clear, O2 stable.  Commending continued symptomatic and supportive care with close monitoring.  Rest and fluids.  Discussed strict return precautions. Patient verbalized understanding and is agreeable with plan.  Final Clinical Impressions(s) / UC Diagnoses   Final  diagnoses:  Viral URI with cough  Suspected COVID-19 virus infection     Discharge Instructions     Covid test pending, monitor my chart for results Ibuprofen and Tylenol around-the-clock to help with fevers, headaches, body aches and chills Rest and drink plenty of fluids Benzonatate/Tessalon every 8 hours as needed for cough Please follow-up if any symptoms not improving or worsening, developing any difficulty breathing, shortness of breath    ED Prescriptions    Medication Sig Dispense Auth. Provider   ibuprofen (ADVIL) 800 MG  tablet Take 1 tablet (800 mg total) by mouth 3 (three) times daily. 21 tablet Juda Lajeunesse C, PA-C   benzonatate (TESSALON) 200 MG capsule Take 1 capsule (200 mg total) by mouth 3 (three) times daily as needed for up to 7 days for cough. 28 capsule Saba Neuman, [place] C, PA-C     PDMP not reviewed this encounter.   Lew Dawes, PA-C 07/27/20 1102
